# Patient Record
Sex: Female | Born: 1985 | Race: White | Hispanic: No | Marital: Married | Smoking: Never smoker
Health system: Southern US, Community
[De-identification: ages and names within clinical notes are randomized; demographics above are authoritative.]

## PROBLEM LIST (undated history)

## (undated) DIAGNOSIS — E079 Disorder of thyroid, unspecified: Secondary | ICD-10-CM

## (undated) HISTORY — PX: EYE SURGERY: SHX253

---

## 2020-12-06 ENCOUNTER — Other Ambulatory Visit: Payer: Self-pay

## 2020-12-06 ENCOUNTER — Emergency Department (HOSPITAL_COMMUNITY)
Admission: EM | Admit: 2020-12-06 | Discharge: 2020-12-06 | Disposition: A | Discharge status: Home / Self Care | Attending: Emergency Medicine | Admitting: Emergency Medicine

## 2020-12-06 ENCOUNTER — Emergency Department (HOSPITAL_COMMUNITY)

## 2020-12-06 ENCOUNTER — Encounter (HOSPITAL_COMMUNITY): Payer: Self-pay | Admitting: Emergency Medicine

## 2020-12-06 DIAGNOSIS — R0781 Pleurodynia: Secondary | ICD-10-CM | POA: Insufficient documentation

## 2020-12-06 DIAGNOSIS — R0602 Shortness of breath: Secondary | ICD-10-CM | POA: Diagnosis not present

## 2020-12-06 DIAGNOSIS — M791 Myalgia, unspecified site: Secondary | ICD-10-CM | POA: Diagnosis not present

## 2020-12-06 DIAGNOSIS — Z20822 Contact with and (suspected) exposure to covid-19: Secondary | ICD-10-CM | POA: Insufficient documentation

## 2020-12-06 DIAGNOSIS — E039 Hypothyroidism, unspecified: Secondary | ICD-10-CM | POA: Insufficient documentation

## 2020-12-06 DIAGNOSIS — R112 Nausea with vomiting, unspecified: Secondary | ICD-10-CM | POA: Insufficient documentation

## 2020-12-06 DIAGNOSIS — R197 Diarrhea, unspecified: Secondary | ICD-10-CM | POA: Diagnosis not present

## 2020-12-06 DIAGNOSIS — R000 Tachycardia, unspecified: Secondary | ICD-10-CM | POA: Diagnosis not present

## 2020-12-06 DIAGNOSIS — R1010 Upper abdominal pain, unspecified: Secondary | ICD-10-CM | POA: Insufficient documentation

## 2020-12-06 HISTORY — DX: Disorder of thyroid, unspecified: E07.9

## 2020-12-06 LAB — CBC WITH DIFFERENTIAL/PLATELET
Abs Immature Granulocytes: 0.03 10*3/uL (ref 0.00–0.07)
Basophils Absolute: 0 10*3/uL (ref 0.0–0.1)
Basophils Relative: 0 %
Eosinophils Absolute: 0 10*3/uL (ref 0.0–0.5)
Eosinophils Relative: 0 %
HCT: 32.6 % — ABNORMAL LOW (ref 36.0–46.0)
Hemoglobin: 11.3 g/dL — ABNORMAL LOW (ref 12.0–15.0)
Immature Granulocytes: 0 %
Lymphocytes Relative: 1 %
Lymphs Abs: 0.1 10*3/uL — ABNORMAL LOW (ref 0.7–4.0)
MCH: 30.2 pg (ref 26.0–34.0)
MCHC: 34.7 g/dL (ref 30.0–36.0)
MCV: 87.2 fL (ref 80.0–100.0)
Monocytes Absolute: 0.2 10*3/uL (ref 0.1–1.0)
Monocytes Relative: 2 %
Neutro Abs: 8.1 10*3/uL — ABNORMAL HIGH (ref 1.7–7.7)
Neutrophils Relative %: 97 %
Platelets: 118 10*3/uL — ABNORMAL LOW (ref 150–400)
RBC: 3.74 MIL/uL — ABNORMAL LOW (ref 3.87–5.11)
RDW: 12.2 % (ref 11.5–15.5)
WBC: 8.4 10*3/uL (ref 4.0–10.5)
nRBC: 0 % (ref 0.0–0.2)

## 2020-12-06 LAB — URINALYSIS, ROUTINE W REFLEX MICROSCOPIC
Bilirubin Urine: NEGATIVE
Glucose, UA: NEGATIVE mg/dL
Hgb urine dipstick: NEGATIVE
Ketones, ur: 20 mg/dL — AB
Nitrite: NEGATIVE
Protein, ur: NEGATIVE mg/dL
Specific Gravity, Urine: >1.046 — ABNORMAL HIGH (ref 1.005–1.030)
pH: 5 (ref 5.0–8.0)

## 2020-12-06 LAB — COMPREHENSIVE METABOLIC PANEL
ALT: 13 U/L (ref 0–44)
AST: 23 U/L (ref 15–41)
Albumin: 3.5 g/dL (ref 3.5–5.0)
Alkaline Phosphatase: 45 U/L (ref 38–126)
Anion gap: 9 (ref 5–15)
BUN: 14 mg/dL (ref 6–20)
CO2: 19 mmol/L — ABNORMAL LOW (ref 22–32)
Calcium: 8.1 mg/dL — ABNORMAL LOW (ref 8.9–10.3)
Chloride: 111 mmol/L (ref 98–111)
Creatinine, Ser: 0.84 mg/dL (ref 0.44–1.00)
GFR, Estimated: >60 mL/min (ref >60)
Glucose, Bld: 160 mg/dL — ABNORMAL HIGH (ref 70–99)
Potassium: 3.1 mmol/L — ABNORMAL LOW (ref 3.5–5.1)
Sodium: 139 mmol/L (ref 135–145)
Total Bilirubin: 2.6 mg/dL — ABNORMAL HIGH (ref 0.3–1.2)
Total Protein: 5.8 g/dL — ABNORMAL LOW (ref 6.5–8.1)

## 2020-12-06 LAB — RESP PANEL BY RT-PCR (FLU A&B, COVID) ARPGX2
Influenza A by PCR: NEGATIVE
Influenza B by PCR: NEGATIVE
SARS Coronavirus 2 by RT PCR: NEGATIVE

## 2020-12-06 LAB — TSH: TSH: 0.969 u[IU]/mL (ref 0.350–4.500)

## 2020-12-06 LAB — TROPONIN I (HIGH SENSITIVITY)
Troponin I (High Sensitivity): 4 ng/L (ref <18)
Troponin I (High Sensitivity): 3 ng/L (ref <18)

## 2020-12-06 LAB — I-STAT BETA HCG BLOOD, ED (MC, WL, AP ONLY): I-stat hCG, quantitative: <5 m[IU]/mL (ref <5)

## 2020-12-06 LAB — D-DIMER, QUANTITATIVE: D-Dimer, Quant: 3.21 ug/mL-FEU — ABNORMAL HIGH (ref 0.00–0.50)

## 2020-12-06 LAB — LIPASE, BLOOD: Lipase: 27 U/L (ref 11–51)

## 2020-12-06 MED ORDER — IOHEXOL 350 MG/ML SOLN
75.0000 mL | Freq: Once | INTRAVENOUS | Status: AC | PRN
  Administered 2020-12-06: 75 mL via INTRAVENOUS

## 2020-12-06 MED ORDER — LORAZEPAM 2 MG/ML IJ SOLN
0.5000 mg | Freq: Once | INTRAMUSCULAR | Status: DC
  Filled 2020-12-06: qty 1

## 2020-12-06 MED ORDER — PANTOPRAZOLE SODIUM 40 MG IV SOLR
40.0000 mg | Freq: Once | INTRAVENOUS | Status: AC
  Administered 2020-12-06: 40 mg via INTRAVENOUS
  Filled 2020-12-06: qty 40

## 2020-12-06 MED ORDER — SODIUM CHLORIDE 0.9 % IV BOLUS
1000.0000 mL | Freq: Once | INTRAVENOUS | Status: AC
  Administered 2020-12-06: 1000 mL via INTRAVENOUS

## 2020-12-06 MED ORDER — LORAZEPAM 2 MG/ML IJ SOLN
1.0000 mg | Freq: Once | INTRAMUSCULAR | Status: AC
  Administered 2020-12-06: 1 mg via INTRAVENOUS

## 2020-12-06 MED ORDER — ONDANSETRON 4 MG PO TBDP: 4.0000 mg | ORAL_TABLET | Freq: Three times a day (TID) | ORAL | 0 refills | Status: AC | PRN

## 2020-12-06 MED ORDER — IBUPROFEN 800 MG PO TABS
800.0000 mg | ORAL_TABLET | Freq: Once | ORAL | Status: AC
  Administered 2020-12-06: 800 mg via ORAL
  Filled 2020-12-06: qty 1

## 2020-12-06 MED ORDER — POTASSIUM CHLORIDE CRYS ER 20 MEQ PO TBCR
40.0000 meq | EXTENDED_RELEASE_TABLET | Freq: Once | ORAL | Status: AC
  Administered 2020-12-06: 40 meq via ORAL
  Filled 2020-12-06: qty 2

## 2020-12-06 MED ORDER — ALBUTEROL SULFATE HFA 108 (90 BASE) MCG/ACT IN AERS
2.0000 | INHALATION_SPRAY | Freq: Once | RESPIRATORY_TRACT | Status: AC
  Administered 2020-12-06: 2 via RESPIRATORY_TRACT
  Filled 2020-12-06: qty 6.7

## 2020-12-06 MED ORDER — ONDANSETRON HCL 4 MG/2ML IJ SOLN
4.0000 mg | Freq: Once | INTRAMUSCULAR | Status: AC
  Administered 2020-12-06: 4 mg via INTRAVENOUS
  Filled 2020-12-06: qty 2

## 2020-12-06 MED ORDER — NAPROXEN 500 MG PO TABS: 500.0000 mg | ORAL_TABLET | Freq: Two times a day (BID) | ORAL | 0 refills | Status: AC | PRN

## 2020-12-06 MED ORDER — POTASSIUM CHLORIDE CRYS ER 20 MEQ PO TBCR: 20.0000 meq | EXTENDED_RELEASE_TABLET | Freq: Every day | ORAL | 0 refills | Status: AC

## 2020-12-06 NOTE — ED Notes (Signed)
PA is at the bedside 

## 2020-12-06 NOTE — ED Notes (Signed)
Lab notified of add on TSH

## 2020-12-06 NOTE — ED Triage Notes (Signed)
Patient arrives via EMS from home- reports onset of fever, body aches with slight abd pain since last night approx 8 pm.  The patient reports being exposed to someone with the flu yesterday but was wearing face shield protection.  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

## 2020-12-06 NOTE — Discharge Instructions (Addendum)
You were seen in the ER today due to trouble breathing, vomiting, diarrhea, and chest pain.   Your blood work showed that your potassium was low- we gave you a supplement in the ER and are sending you home with a few days of this, please see attached diet guidelines.   Your labs also showed that you are hemoglobin (anemia) and your platelet counts are mildly low, your bilirubin is elevated, and you have additional findings of dehydration.   Please have your blood work rechecked by primary care within 1 week.   Your flu test and Covid test were negative.  With your D-dimer test being elevated we obtained a CT scan of your chest which did not show any blood clots or other significant abnormalities.  We are sending home with the following medicines to continue to help to treat your symptoms: -Zofran: Take every 8 hours as needed for nausea and vomiting -Naproxen: Take every 12 hours with food as needed for pain.  Take other NSAIDs with this such as Motrin, Advil, Aleve, Goody powder, Mobic, ibuprofen etc. as this is a similar medicine -Potassium chloride: Take daily for the next 5 days, this is a potassium supplement.  Please call your primary care doctor today to schedule an appointment for follow-up soon as possible.  Return to the ER for any new or worsening symptoms including but not limited to increased trouble breathing, new or worsening chest pain, passing out, inability to keep fluids down, blood in vomit or stool, new or worsening abdominal pain, abnormal bleeding, fever, or any other concerns.

## 2020-12-06 NOTE — ED Notes (Signed)
Patient to CT.

## 2020-12-06 NOTE — ED Notes (Signed)
Returns from CT- requesting medication for headache

## 2020-12-06 NOTE — ED Provider Notes (Addendum)
[facility]-EMERGENCY DEPT Provider Note   CSN: [id number] Arrival date & time: 12/06/20  [id number]    History Chief Complaint  Patient presents with  . Shortness of Breath    Beth Hensley is a 35 y.o. female with a hx of hypothyroidism who presents to the ED via EMS with complaints of dyspnea & N/V since last night.  Patient states she started to feel poorly last night with fatigue & subsequent N/V, vomiting every hour, has since started to feel short of breath with left sided pleuritic chest pain, upper abdominal pain, chills, generalized aches, and had 1 episode of diarrhea. Febrile to 101 with EMS, given tylenol PTA. No other alleviating/aggravating factors. Denies syncope, cough, hemoptysis, unilateral leg swelling, recent long travel/surgery, personal hx of VTE/cancer, or hormone use. Family hx of blood clots. Vaccinated against covid & influenza. Denies sick contacts w/ similar. Had tattoo done 12/04/20 which haw been healing as expected.   HPI     Past Medical History:  Diagnosis Date  . Thyroid disease     There are no problems to display for this patient.   Past Surgical History:  Procedure Laterality Date  . EYE SURGERY       OB History    Gravida  2   Para  2   Term      Preterm      AB      Living        SAB      IAB      Ectopic      Multiple      Live Births              History reviewed. No pertinent family history.  Social History   Tobacco Use  . Smoking status: Never Smoker  . Smokeless tobacco: Never Used  Vaping Use  . Vaping Use: Never used  Substance Use Topics  . Alcohol use: Yes    Alcohol/week: 1.0 standard drink    Types: 1 Glasses of wine per week  . Drug use: Yes    Comment: oxycodone prescribed for back pain    Home Medications Prior to Admission medications   Not on File    Allergies    Patient has no known allergies.  Review of Systems   Review of Systems  Constitutional: Positive for  chills, fatigue and fever.  HENT: Negative for congestion.   Respiratory: Positive for shortness of breath. Negative for cough.   Cardiovascular: Positive for chest pain. Negative for leg swelling.  Gastrointestinal: Positive for abdominal pain, diarrhea, nausea and vomiting. Negative for blood in stool.  Genitourinary: Negative for dysuria.  Musculoskeletal: Positive for myalgias.  Neurological: Negative for syncope.  All other systems reviewed and are negative.   Physical Exam Updated Vital Signs BP 122/73 (BP Location: Right Arm)   Pulse (!) 118   Temp 99.1 F (37.3 C) (Oral)   Resp (!) 22   Ht 5\' 3"  (1.6 m)   Wt 56.7 kg   LMP 11/22/2020 (Exact Date)   SpO2 100%   BMI 22.14 kg/m   Physical Exam Vitals and nursing note reviewed.  Constitutional:      General: She is not in acute distress.    Appearance: She is well-developed. She is not toxic-appearing.  HENT:     Head: Normocephalic and atraumatic.  Eyes:     General:        Right eye: No discharge.  Left eye: No discharge.     Conjunctiva/sclera: Conjunctivae normal.     Pupils: Pupils are equal, round, and reactive to light.  Cardiovascular:     Rate and Rhythm: Regular rhythm. Tachycardia present.     Comments: 2+ symmetric DP pulses.  Pulmonary:     Effort: No respiratory distress.     Breath sounds: Normal breath sounds. No wheezing, rhonchi or rales.  Chest:     Chest wall: No deformity, tenderness or crepitus.  Abdominal:     General: There is no distension.     Palpations: Abdomen is soft.     Tenderness: There is abdominal tenderness (diffuse upper). There is no guarding or rebound.  Musculoskeletal:     Cervical back: Neck supple.     Right lower leg: No edema.     Left lower leg: No edema.     Comments: Moving all extremities.   Skin:    General: Skin is warm and dry.     Findings: No rash.     Comments: Multiple tattoos including to lower back, no erythema/warmth/drainage noted.    Neurological:     Mental Status: She is alert.     Comments: Clear speech.   Psychiatric:        Behavior: Behavior normal.    ED Results / Procedures / Treatments   Labs (all labs ordered are listed, but only abnormal results are displayed) Labs Reviewed  COMPREHENSIVE METABOLIC PANEL - Abnormal; Notable for the following components:      Result Value   Potassium 3.1 (*)    CO2 19 (*)    Glucose, Bld 160 (*)    Calcium 8.1 (*)    Total Protein 5.8 (*)    Total Bilirubin 2.6 (*)    All other components within normal limits  CBC WITH DIFFERENTIAL/PLATELET - Abnormal; Notable for the following components:   RBC 3.74 (*)    Hemoglobin 11.3 (*)    HCT 32.6 (*)    Platelets 118 (*)    Neutro Abs 8.1 (*)    Lymphs Abs 0.1 (*)    All other components within normal limits  D-DIMER, QUANTITATIVE - Abnormal; Notable for the following components:   D-Dimer, Quant 3.21 (*)    All other components within normal limits  RESP PANEL BY RT-PCR (FLU A&B, COVID) ARPGX2  LIPASE, BLOOD  I-STAT BETA HCG BLOOD, ED ([facility], [facility], [facility] ONLY)  TROPONIN I (HIGH SENSITIVITY)  TROPONIN I (HIGH SENSITIVITY)    EKG EKG Interpretation  Date/Time:  Wednesday December 06 2020 09:15:08 EDT Ventricular Rate:  122 PR Interval:  137 QRS Duration: 88 QT Interval:  335 QTC Calculation: 478 R Axis:   82 Text Interpretation: Sinus tachycardia Borderline repolarization abnormality Sinus tachycardia, normal intervals, no previous to compare to Confirmed by Coralee Pesa [phone]) on 12/06/2020 9:46:04 AM   Radiology CT Angio Chest PE W/Cm &/Or Wo Cm  Result Date: 12/06/2020 CLINICAL DATA:  Positive D-dimer. EXAM: CT ANGIOGRAPHY CHEST WITH CONTRAST TECHNIQUE: Multidetector CT imaging of the chest was performed using the standard protocol during bolus administration of intravenous contrast. Multiplanar CT image reconstructions and MIPs were obtained to evaluate the vascular anatomy. CONTRAST:  12mL OMNIPAQUE IOHEXOL  350 MG/ML SOLN COMPARISON:  None. FINDINGS: Cardiovascular: No filling defects in the pulmonary arteries to suggest pulmonary emboli. Heart is normal size. Aorta is normal caliber. Mediastinum/Nodes: No mediastinal, hilar, or axillary adenopathy. Trachea and esophagus are unremarkable. Thyroid unremarkable. Lungs/Pleura: Lungs are clear. No focal airspace  opacities or suspicious nodules. No effusions. Upper Abdomen: Imaging into the upper abdomen demonstrates no acute findings. Musculoskeletal: Chest wall soft tissues are unremarkable. No acute bony abnormality. Review of the MIP images confirms the above findings. IMPRESSION: No acute cardiopulmonary disease. Electronically Signed   By: Charlett Nose M.D.   On: 12/06/2020 11:52   DG Chest Portable 1 View  Result Date: 12/06/2020 CLINICAL DATA:  Chest pain and dyspnea EXAM: PORTABLE CHEST 1 VIEW COMPARISON:  None. FINDINGS: The heart size and mediastinal contours are within normal limits. Both lungs are clear. The visualized skeletal structures are unremarkable. IMPRESSION: No active disease. Electronically Signed   By: Alcide Clever M.D.   On: 12/06/2020 10:00    Procedures Procedures   Medications Ordered in ED Medications  sodium chloride 0.9 % bolus 1,000 mL (0 mLs Intravenous Stopped 12/06/20 1218)  pantoprazole (PROTONIX) injection 40 mg (40 mg Intravenous Given 12/06/20 1116)  ondansetron (ZOFRAN) injection 4 mg (4 mg Intravenous Given 12/06/20 1116)  sodium chloride 0.9 % bolus 1,000 mL (0 mLs Intravenous Stopped 12/06/20 1316)  potassium chloride SA (KLOR-CON) CR tablet 40 mEq (40 mEq Oral Given 12/06/20 1116)  iohexol (OMNIPAQUE) 350 MG/ML injection 75 mL (75 mLs Intravenous Contrast Given 12/06/20 1132)  ibuprofen (ADVIL) tablet 800 mg (800 mg Oral Given 12/06/20 1218)  albuterol (VENTOLIN HFA) 108 (90 Base) MCG/ACT inhaler 2 puff (2 puffs Inhalation Given 12/06/20 1219)  sodium chloride 0.9 % bolus 1,000 mL (1,000 mLs Intravenous New Bag/Given  12/06/20 1317)  LORazepam (ATIVAN) injection 1 mg (1 mg Intravenous Given 12/06/20 1400)    ED Course  I have reviewed the triage vital signs and the nursing notes.  Pertinent labs & imaging results that were available during my care of the patient were reviewed by me and considered in my medical decision making (see chart for details).  Demecia Northway was evaluated in Emergency Department on 12/06/2020 for the symptoms described in the history of present illness. He/she was evaluated in the context of the global COVID-19 pandemic, which necessitated consideration that the patient might be at risk for infection with the SARS-CoV-2 virus that causes COVID-19. Institutional protocols and algorithms that pertain to the evaluation of patients at risk for COVID-19 are in a state of rapid change based on information released by regulatory bodies including the CDC and federal and state organizations. These policies and algorithms were followed during the patient's care in the ED.    MDM Rules/Calculators/A&P                          Patient presents to the ED with complaints of dyspnea, N/V/D, & constitutional symptoms. On arrival patient is tachycardic, vitals otherwise reassuring. Temp reported to be 101 prior to arrival, received tylenol en route.   Additional history obtained:  Additional history obtained from chart review & nursing note review.   EKG: Sinus tachycardia Borderline repolarization abnormality Sinus tachycardia, normal intervals, no previous to compare to   Lab Tests:  I Ordered, reviewed, and interpreted labs, which included:  Covid/influenza: Negative CBC: Mild anemia.  CMP: Elevated t bili @ 2.6, mild hypokalemia & hypocalcemia. Bicarb 19, anion gap WNL  Lipase: WNL Preg test :Negative D-dimer: elevated- CTA ordered.   Imaging Studies ordered:  I ordered imaging studies which included CXR & subsequent CTA, I independently reviewed, formal radiology impression shows:  CXR: No  active disease CTA: No acute cardiopulmonary disease.  ED Course:  12:05: RE-EVAL:  Patient overall feeling improved, GI sxs resolved, currently has mild headache as well as continued dyspnea. No nuchal rigidity, no neuro deficits. Will give ibuprofen & trial albuterol inhaler.   Continued tachycardia, additional 1L fliuds ordered, add on TSH.   14:00: Per RN patient feeling nauseous- ativan ordered.   TSH: WNL UA: No urinary sxs to raise concern for UTI, small leuks nitrite negative, ketonuria & elevated specific gravity consistent w/ suspected dehydration.   Patient without ischemic EKG changes, delta troponin flat, doubt ACS.  Low risk wells, d-dimer positive--> CTA negative for PE. CTA is also negative for pneumonia, pneumothorax, or findings to suggest CHF. Labs with mild electrolyte abnormalities- receiving NS in the ED, oral potassium. Mildly elevated T bili, LFTs WNL, repeat abdominal exam nontender, negative murphys, PCP recheck, do not suspect acute surgical abdomen with reassuring serial exams and tolerating PO. Her SpO2 is 05-100% on RA @ rest, she is not in respiratory distress, able to ambulate without significant desaturation, remains in upper 90s per discussion with nursing. No meningismus or neuro deficits. HR on reassessment 101 bpm.   She is feeling somewhat improved, tolerating PO, overall unclear definitive etiology, reassuring ED work-up, suspect this could be viral. Plan for supportive tx & PCP follow up. I discussed results, treatment plan, need for follow-up, and return precautions with the patient & her husband @ bedside. Provided opportunity for questions, patient & her husband confirmed understanding and are in agreement with plan.   Portions of this note were generated with Scientist, clinical (histocompatibility and immunogenetics). Dictation errors may occur despite best attempts at proofreading.  Findings and plan of care discussed with supervising physician Dr. Wilkie Aye.   Final Clinical  Impression(s) / ED Diagnoses Final diagnoses:  Shortness of breath  Nausea vomiting and diarrhea    Rx / DC Orders ED Discharge Orders         Ordered    ondansetron (ZOFRAN ODT) 4 MG disintegrating tablet  Every 8 hours PRN        12/06/20 1502    potassium chloride SA (KLOR-CON) 20 MEQ tablet  Daily        12/06/20 1502    naproxen (NAPROSYN) 500 MG tablet  2 times daily PRN        12/06/20 1502           Kamauri Denardo, [place] R, PA-C 12/06/20 1526    Sincerity Cedar, [place] R, PA-C 12/06/20 1527    Rozelle Logan, DO 12/07/20 1953

## 2020-12-06 NOTE — ED Notes (Signed)
Patient ambulated in the hallway with staff and her husband- portable 02 saturation monitor with oxygen reading of 97-98%  Patient appears very drowsy from earlier medication but answers all questions appropriately

## 2020-12-06 NOTE — ED Notes (Signed)
Patient's husband was given written and verbal instructions regarding discharge.  He verbalizes understanding those instructions

## 2020-12-13 ENCOUNTER — Other Ambulatory Visit: Payer: Self-pay

## 2020-12-13 ENCOUNTER — Emergency Department (HOSPITAL_COMMUNITY)

## 2020-12-13 ENCOUNTER — Emergency Department (HOSPITAL_COMMUNITY)
Admission: EM | Admit: 2020-12-13 | Discharge: 2020-12-13 | Disposition: A | Discharge status: Home / Self Care | Attending: Emergency Medicine | Admitting: Emergency Medicine

## 2020-12-13 ENCOUNTER — Encounter (HOSPITAL_COMMUNITY): Payer: Self-pay

## 2020-12-13 ENCOUNTER — Emergency Department (HOSPITAL_BASED_OUTPATIENT_CLINIC_OR_DEPARTMENT_OTHER)

## 2020-12-13 DIAGNOSIS — R0602 Shortness of breath: Secondary | ICD-10-CM | POA: Diagnosis not present

## 2020-12-13 DIAGNOSIS — L739 Follicular disorder, unspecified: Secondary | ICD-10-CM | POA: Diagnosis not present

## 2020-12-13 DIAGNOSIS — R7989 Other specified abnormal findings of blood chemistry: Secondary | ICD-10-CM

## 2020-12-13 DIAGNOSIS — M7989 Other specified soft tissue disorders: Secondary | ICD-10-CM | POA: Diagnosis not present

## 2020-12-13 DIAGNOSIS — R2243 Localized swelling, mass and lump, lower limb, bilateral: Secondary | ICD-10-CM | POA: Diagnosis present

## 2020-12-13 DIAGNOSIS — R609 Edema, unspecified: Secondary | ICD-10-CM

## 2020-12-13 LAB — CBC WITH DIFFERENTIAL/PLATELET
Abs Immature Granulocytes: 1.3 10*3/uL — ABNORMAL HIGH (ref 0.00–0.07)
Basophils Absolute: 0.1 10*3/uL (ref 0.0–0.1)
Basophils Relative: 1 %
Eosinophils Absolute: 0 10*3/uL (ref 0.0–0.5)
Eosinophils Relative: 0 %
HCT: 36.7 % (ref 36.0–46.0)
Hemoglobin: 12.3 g/dL (ref 12.0–15.0)
Immature Granulocytes: 9 %
Lymphocytes Relative: 27 %
Lymphs Abs: 3.9 10*3/uL (ref 0.7–4.0)
MCH: 29.9 pg (ref 26.0–34.0)
MCHC: 33.5 g/dL (ref 30.0–36.0)
MCV: 89.3 fL (ref 80.0–100.0)
Monocytes Absolute: 1.5 10*3/uL — ABNORMAL HIGH (ref 0.1–1.0)
Monocytes Relative: 10 %
Neutro Abs: 7.8 10*3/uL — ABNORMAL HIGH (ref 1.7–7.7)
Neutrophils Relative %: 53 %
Platelets: 203 10*3/uL (ref 150–400)
RBC: 4.11 MIL/uL (ref 3.87–5.11)
RDW: 13.1 % (ref 11.5–15.5)
WBC: 14.6 10*3/uL — ABNORMAL HIGH (ref 4.0–10.5)
nRBC: 0 % (ref 0.0–0.2)

## 2020-12-13 LAB — COMPREHENSIVE METABOLIC PANEL
ALT: 37 U/L (ref 0–44)
AST: 20 U/L (ref 15–41)
Albumin: 3.6 g/dL (ref 3.5–5.0)
Alkaline Phosphatase: 70 U/L (ref 38–126)
Anion gap: 8 (ref 5–15)
BUN: 16 mg/dL (ref 6–20)
CO2: 31 mmol/L (ref 22–32)
Calcium: 9.1 mg/dL (ref 8.9–10.3)
Chloride: 104 mmol/L (ref 98–111)
Creatinine, Ser: 0.69 mg/dL (ref 0.44–1.00)
GFR, Estimated: >60 mL/min (ref >60)
Glucose, Bld: 80 mg/dL (ref 70–99)
Potassium: 3.3 mmol/L — ABNORMAL LOW (ref 3.5–5.1)
Sodium: 143 mmol/L (ref 135–145)
Total Bilirubin: 0.5 mg/dL (ref 0.3–1.2)
Total Protein: 6.2 g/dL — ABNORMAL LOW (ref 6.5–8.1)

## 2020-12-13 LAB — I-STAT BETA HCG BLOOD, ED (MC, WL, AP ONLY): I-stat hCG, quantitative: <5 m[IU]/mL (ref <5)

## 2020-12-13 LAB — TSH: TSH: 17.207 u[IU]/mL — ABNORMAL HIGH (ref 0.350–4.500)

## 2020-12-13 LAB — BRAIN NATRIURETIC PEPTIDE: B Natriuretic Peptide: 113.7 pg/mL — ABNORMAL HIGH (ref 0.0–100.0)

## 2020-12-13 LAB — TROPONIN I (HIGH SENSITIVITY): Troponin I (High Sensitivity): <2 ng/L (ref <18)

## 2020-12-13 MED ORDER — DOXYCYCLINE HYCLATE 100 MG PO CAPS: 100.0000 mg | ORAL_CAPSULE | Freq: Two times a day (BID) | ORAL | 0 refills | Status: AC

## 2020-12-13 NOTE — ED Triage Notes (Signed)
Emergency Medicine Provider Triage Evaluation Note  Beth Hensley , a 35 y.o. female  was evaluated in triage.  Pt complains of ble edema, sob, chest pain starting a few days ago. States she had petechiae a few days ago that is improving fam hx vte. See on 4/13 and had neg cta chest.  Review of Systems  Positive: ble edema, chest pain, sob Negative: fever  Physical Exam  BP 124/90   Pulse 76   Temp 98.1 F (36.7 C)   Resp 18   LMP 11/22/2020 (Exact Date)   SpO2 99%  Gen:   Awake, no distress   HEENT:  Atraumatic  Resp:  Normal effort  Cardiac:  Normal rate  Abd:   Nondistended, mild epigastric ttp MSK:   Moves extremities without difficulty  Neuro:  Speech clear   Medical Decision Making  Medically screening exam initiated at 4:30 PM.  Appropriate orders placed.  Beth Hensley was informed that the remainder of the evaluation will be completed by another provider, this initial triage assessment does not replace that evaluation, and the importance of remaining in the ED until their evaluation is complete.  Clinical Impression     MSE was initiated and I personally evaluated the patient and placed orders (if any) at  4:33 PM on December 13, 2020.  The patient appears stable so that the remainder of the MSE may be completed by another provider.    Beth Hensley, [place] 12/13/20 1633

## 2020-12-13 NOTE — Progress Notes (Signed)
Lower extremity venous bilateral study completed.  Preliminary results relayed to [place], PA.  See CV Proc for preliminary results report.   Jillyan Plitt, RDMS, RVT  

## 2020-12-13 NOTE — ED Notes (Signed)
US at bedside

## 2020-12-13 NOTE — Discharge Instructions (Addendum)
It was a pleasure taking care of you here in the emergency department.  As discussed in the event your chest x-ray did not show any evidence of heart failure, fluid on your lungs, pneumonia.  Your thyroid level was elevated.  You will need to follow-up with your primary care provider for this  Make sure to elevate your legs over the next few days.   Your ultrasound did not show any evidence of blood clot or infectious process in your legs.  You do have a rash on your back which is consistent with folliculitis.  I have written you a prescription for doxycycline.  Please take with food.  Follow-up with your primary care provider within the next week

## 2020-12-13 NOTE — ED Triage Notes (Signed)
Pt reports SHOB and bilateral leg swelling since 4/13. Pt reports following up with PCP today and was sent here to rule out DVT. Pt also endorses numbness to right upper leg.

## 2020-12-13 NOTE — ED Provider Notes (Signed)
[facility]-EMERGENCY DEPT Provider Note   CSN: [id number] Arrival date & time: 12/13/20  1612     History Chief Complaint  Patient presents with  . Shortness of Breath  . Leg Swelling    Beth Hensley is a 35 y.o. female with past medical history significant for thyroid disease who presents for evaluation of bilateral lower extremity edema.  Patient states seen here in the emergency department approximately 1 week ago due to shortness of breath.  Had unremarkable work-up.  CTA of her chest was negative for any PE, pneumonia.  Patient states her symptoms have generally improved.  She does have occasional shortness of breath.  However not worsening.  Does state that she was seen by her PCP today.  She had a long road trip last week where she drove to [place].  She felt like she had bilateral lower extremity edema since then.  She has never had an ultrasound of this.  She denies any personal history of blood clots or does have a family history of blood clots.  No current chest pain.  She questions whether anxiety is causing her shortness of breath.  No recent fever, chills, nausea, vomiting, hemoptysis, dysuria, hematuria, paresthesias or weakness.  Does states she had a tattoo approximately 1 week ago to her lower back.  She has noted a rash to this area since.  Denies additional aggravating or alleviating factors.  No history of heart failure.  History obtained from patient and past medical records.  No interpreter used  HPI     Past Medical History:  Diagnosis Date  . Thyroid disease     There are no problems to display for this patient.   Past Surgical History:  Procedure Laterality Date  . EYE SURGERY       OB History    Gravida  2   Para  2   Term      Preterm      AB      Living        SAB      IAB      Ectopic      Multiple      Live Births              History reviewed. No pertinent family history.  Social History   Tobacco  Use  . Smoking status: Never Smoker  . Smokeless tobacco: Never Used  Vaping Use  . Vaping Use: Never used  Substance Use Topics  . Alcohol use: Yes    Alcohol/week: 1.0 standard drink    Types: 1 Glasses of wine per week  . Drug use: Yes    Comment: oxycodone prescribed for back pain    Home Medications Prior to Admission medications   Medication Sig Start Date End Date Taking? Authorizing Provider  doxycycline (VIBRAMYCIN) 100 MG capsule Take 1 capsule (100 mg total) by mouth 2 (two) times daily for 7 days. 12/13/20 12/20/20 Yes Debe Anfinson A, PA-C  levothyroxine (SYNTHROID) 100 MCG tablet Take 100 mcg by mouth daily. 08/24/19   [provider]  naproxen (NAPROSYN) 500 MG tablet Take 1 tablet (500 mg total) by mouth 2 (two) times daily as needed for moderate pain. 12/06/20   Petrucelli, Samantha R, PA-C  ondansetron (ZOFRAN ODT) 4 MG disintegrating tablet Take 1 tablet (4 mg total) by mouth every 8 (eight) hours as needed for nausea or vomiting. 12/06/20   Petrucelli, Samantha R, PA-C  potassium chloride SA (KLOR-CON) 20  MEQ tablet Take 1 tablet (20 mEq total) by mouth daily. 12/06/20   Petrucelli, Pleas Koch, PA-C    Allergies    Patient has no known allergies.  Review of Systems   Review of Systems  Constitutional: Negative.   HENT: Negative.   Respiratory: Positive for shortness of breath.   Cardiovascular: Positive for leg swelling (BL). Negative for palpitations.  Gastrointestinal: Negative.   Genitourinary: Negative.   Musculoskeletal: Negative.   Skin: Positive for rash. Negative for pallor and wound.  Neurological: Negative.   All other systems reviewed and are negative.  Physical Exam Updated Vital Signs BP 117/83 (BP Location: Left Arm)   Pulse 65   Temp 98.1 F (36.7 C)   Resp 16   LMP 11/22/2020 (Exact Date)   SpO2 98%   Physical Exam Vitals and nursing note reviewed.  Constitutional:      General: She is not in acute distress.     Appearance: She is well-developed. She is not ill-appearing, toxic-appearing or diaphoretic.     Comments: tearful  HENT:     Head: Normocephalic and atraumatic.  Eyes:     Pupils: Pupils are equal, round, and reactive to light.  Cardiovascular:     Rate and Rhythm: Normal rate.     Pulses: Normal pulses.     Heart sounds: Normal heart sounds.  Pulmonary:     Effort: Pulmonary effort is normal. No respiratory distress.     Breath sounds: Normal breath sounds. No decreased breath sounds, wheezing, rhonchi or rales.     Comments: Speaks in full sentences without difficulty.  Lungs clear to auscultation bilaterally. Chest:     Comments: Equal rise and fall to chest wall.  No step-off, crepitus Abdominal:     General: Bowel sounds are normal. There is no distension.     Palpations: Abdomen is soft. There is no mass.     Tenderness: There is no abdominal tenderness. There is no guarding or rebound.     Comments: Soft, nontender without rebound or guarding  Musculoskeletal:        General: Normal range of motion.     Cervical back: Normal range of motion.     Right lower leg: No tenderness. No edema.     Left lower leg: No tenderness. No edema.     Comments: No bony tenderness.  Moves all 4 extremity without difficulty.  No appreciable lower extremity edema.  Skin:    General: Skin is warm and dry.     Capillary Refill: Capillary refill takes less than 2 seconds.     Comments: Rash diffusely located around lumbar area with multiple, small pustules.  Nondermatomal pattern.  Crosses the midline.  No vesicles, bullae, desquamated skin.   Neurological:     General: No focal deficit present.     Mental Status: She is alert and oriented to person, place, and time.    ED Results / Procedures / Treatments   Labs (all labs ordered are listed, but only abnormal results are displayed) Labs Reviewed  COMPREHENSIVE METABOLIC PANEL - Abnormal; Notable for the following components:      Result  Value   Potassium 3.3 (*)    Total Protein 6.2 (*)    All other components within normal limits  CBC WITH DIFFERENTIAL/PLATELET - Abnormal; Notable for the following components:   WBC 14.6 (*)    Neutro Abs 7.8 (*)    Monocytes Absolute 1.5 (*)    Abs Immature Granulocytes 1.30 (*)  All other components within normal limits  BRAIN NATRIURETIC PEPTIDE - Abnormal; Notable for the following components:   B Natriuretic Peptide 113.7 (*)    All other components within normal limits  TSH - Abnormal; Notable for the following components:   TSH 17.207 (*)    All other components within normal limits  I-STAT BETA HCG BLOOD, ED ([facility], [facility], [facility] ONLY)  TROPONIN I (HIGH SENSITIVITY)    EKG None  Radiology DG Chest 2 View  Result Date: 12/13/2020 CLINICAL DATA:  Shortness of breath EXAM: CHEST - 2 VIEW COMPARISON:  12/06/2020 FINDINGS: The heart size and mediastinal contours are within normal limits. Both lungs are clear. The visualized skeletal structures are unremarkable. IMPRESSION: No active cardiopulmonary disease. Electronically Signed   By: Jasmine Pang M.D.   On: 12/13/2020 17:17   VAS [place] LOWER EXTREMITY VENOUS (DVT) (ONLY [facility] & [facility] 7a-7p)  Result Date: 12/13/2020  Lower Venous DVT Study Indications: Swelling, and SOB.  Comparison Study: No prior studies. Performing Technologist: Jean Rosenthal RDMS,RVT  Examination Guidelines: A complete evaluation includes B-mode imaging, spectral Doppler, color Doppler, and power Doppler as needed of all accessible portions of each vessel. Bilateral testing is considered an integral part of a complete examination. Limited examinations for reoccurring indications may be performed as noted. The reflux portion of the exam is performed with the patient in reverse Trendelenburg.  +---------+---------------+---------+-----------+----------+--------------+ RIGHT    CompressibilityPhasicitySpontaneityPropertiesThrombus Aging  +---------+---------------+---------+-----------+----------+--------------+ CFV      Full           Yes      Yes                                 +---------+---------------+---------+-----------+----------+--------------+ SFJ      Full                                                        +---------+---------------+---------+-----------+----------+--------------+ FV Prox  Full                                                        +---------+---------------+---------+-----------+----------+--------------+ FV Mid   Full                                                        +---------+---------------+---------+-----------+----------+--------------+ FV DistalFull                                                        +---------+---------------+---------+-----------+----------+--------------+ PFV      Full                                                        +---------+---------------+---------+-----------+----------+--------------+  POP      Full           Yes      Yes                                 +---------+---------------+---------+-----------+----------+--------------+ PTV      Full                                                        +---------+---------------+---------+-----------+----------+--------------+ PERO     Full                                                        +---------+---------------+---------+-----------+----------+--------------+   +---------+---------------+---------+-----------+----------+--------------+ LEFT     CompressibilityPhasicitySpontaneityPropertiesThrombus Aging +---------+---------------+---------+-----------+----------+--------------+ CFV      Full           Yes      Yes                                 +---------+---------------+---------+-----------+----------+--------------+ SFJ      Full                                                         +---------+---------------+---------+-----------+----------+--------------+ FV Prox  Full                                                        +---------+---------------+---------+-----------+----------+--------------+ FV Mid   Full                                                        +---------+---------------+---------+-----------+----------+--------------+ FV DistalFull                                                        +---------+---------------+---------+-----------+----------+--------------+ PFV      Full                                                        +---------+---------------+---------+-----------+----------+--------------+ POP      Full           Yes      Yes                                 +---------+---------------+---------+-----------+----------+--------------+  PTV      Full                                                        +---------+---------------+---------+-----------+----------+--------------+ PERO     Full                                                        +---------+---------------+---------+-----------+----------+--------------+     Summary: RIGHT: - There is no evidence of deep vein thrombosis in the lower extremity.  - No cystic structure found in the popliteal fossa.  LEFT: - There is no evidence of deep vein thrombosis in the lower extremity.  - No cystic structure found in the popliteal fossa.  *See table(s) above for measurements and observations.    Preliminary     Procedures Procedures   Medications Ordered in ED Medications - No data to display  ED Course  I have reviewed the triage vital signs and the nursing notes.  Pertinent labs & imaging results that were available during my care of the patient were reviewed by me and considered in my medical decision making (see chart for details).  35 year old here for evaluation of multiple complaints.  She is afebrile, nonseptic, not ill-appearing.  Was seen  here for last week for shortness of breath.  Labs and imaging reviewed.  Had CTA which is negative for PE, infectious process.  Was febrile at that time.  States she has noted lower extremity edema.  Her compartments are soft.  She has no bony tenderness.  I do not appreciate any lower extremity edema on exam.  States her shortness of breath that she was here for last week has improved.  Still has some intermittent shortness of breath. No hemoptysis.  Patient questions whether anxiety causes her to have shortness of breath.  She does appear anxious and tearful in room.  She has rash located to her lower back with small pustules surround her in her tattoo which she got approximately 1 week ago.  Rash consistent with folliculitis.  Low suspicion for cellulitis, abscess.  We will plan on labs, imaging and reassess  Labs and imaging personally reviewed and interpreted:  DG chest without any evidence of cardiomegaly, pulmonary edema, pneumothorax, infiltrates Ultrasound negative for DVT however does have enlarged inguinal lymph node per [place] tech Pregnancy test negative CBC with leukocytosis at 14.6 TSH 17.2 BNP 113.7 Trop <2 CMP Potassium 3.3 EKG without ischemic changes  Patient reassessed.  Discussed labs and imaging.  Unclear etiology of patient's feeling of sensation of swelling in her legs.  She has no evidence of pretibial myxedema, pitting edema or any infectious process on exam. No amlodipine or other meds to cause edema. [place] reassuring without any evidence of DVT.  She is Wells criteria low risk, she is without tachycardia, tachypnea or hypoxia.  Had CTA of her chest last week with similar complaints.  I have low suspicion for acute PE.  Her abdominal exam is benign without evidence of acute surgical abdomen. No evidence of anasarca on exam. On repeat exam patient does not have a surgical abdomin and there are no peritoneal signs.  No indication of  appendicitis, bowel obstruction, bowel  perforation, cholecystitis, diverticulitis, PID or ectopic pregnancy.  She did request that we get a "immunology panel."  I am not too sure what she is asking for it.  She says she spoke to her PCP and they were requesting this.  I reviewed her chart in epic, do not see any notes from PCP. She has no joint inflammation on exam.  No excessive diarrhea to suggest IBD.  We will have her follow with PCP for this as I do not feel this is emergently needed at this time.  With regards to her thyroid level.  Elevated here.  She is currently on Synthroid.  [facility]  Discussed close follow-up.  She has no evidence of acute hyper or hypothyroid emergency requiring admission to hospital at this time.  The patient has been appropriately medically screened and/or stabilized in the ED. I have low suspicion for any other emergent medical condition which would require further screening, evaluation or treatment in the ED or require inpatient management.  Patient is hemodynamically stable and in no acute distress.  Patient able to ambulate in department prior to ED.  Evaluation does not show acute pathology that would require ongoing or additional emergent interventions while in the emergency department or further inpatient treatment.  I have discussed the diagnosis with the patient and answered all questions.  Pain is been managed while in the emergency department and patient has no further complaints prior to discharge.  Patient is comfortable with plan discussed in room and is stable for discharge at this time.  I have discussed strict return precautions for returning to the emergency department.  Patient was encouraged to follow-up with PCP/specialist refer to at discharge.    MDM Rules/Calculators/A&P                          Charlynn CourtDarcy Skerritt was evaluated in Emergency Department on 12/13/2020 for the symptoms described in the history of present illness. She was evaluated in the context of the  global COVID-19 pandemic, which necessitated consideration that the patient might be at risk for infection with the SARS-CoV-2 virus that causes COVID-19. Institutional protocols and algorithms that pertain to the evaluation of patients at risk for COVID-19 are in a state of rapid change based on information released by regulatory bodies including the CDC and federal and state organizations. These policies and algorithms were followed during the patient's care in the ED. Final Clinical Impression(s) / ED Diagnoses Final diagnoses:  Peripheral edema  Elevated TSH  Folliculitis    Rx / DC Orders ED Discharge Orders         Ordered    doxycycline (VIBRAMYCIN) 100 MG capsule  2 times daily        12/13/20 1948           Jericho Alcorn A, PA-C 12/13/20 [id number]    Pfeiffer, Marcy, MD 12/26/20 1524

## 2020-12-13 NOTE — ED Notes (Signed)
PA-C at bedside 

## 2022-02-26 IMAGING — DX DG CHEST 1V PORT
1 series · 1 of 1 positions shown · non-contrast
Comparison: None.

CLINICAL DATA: Chest pain and dyspnea

EXAM:
PORTABLE CHEST 1 VIEW

[chest ap]
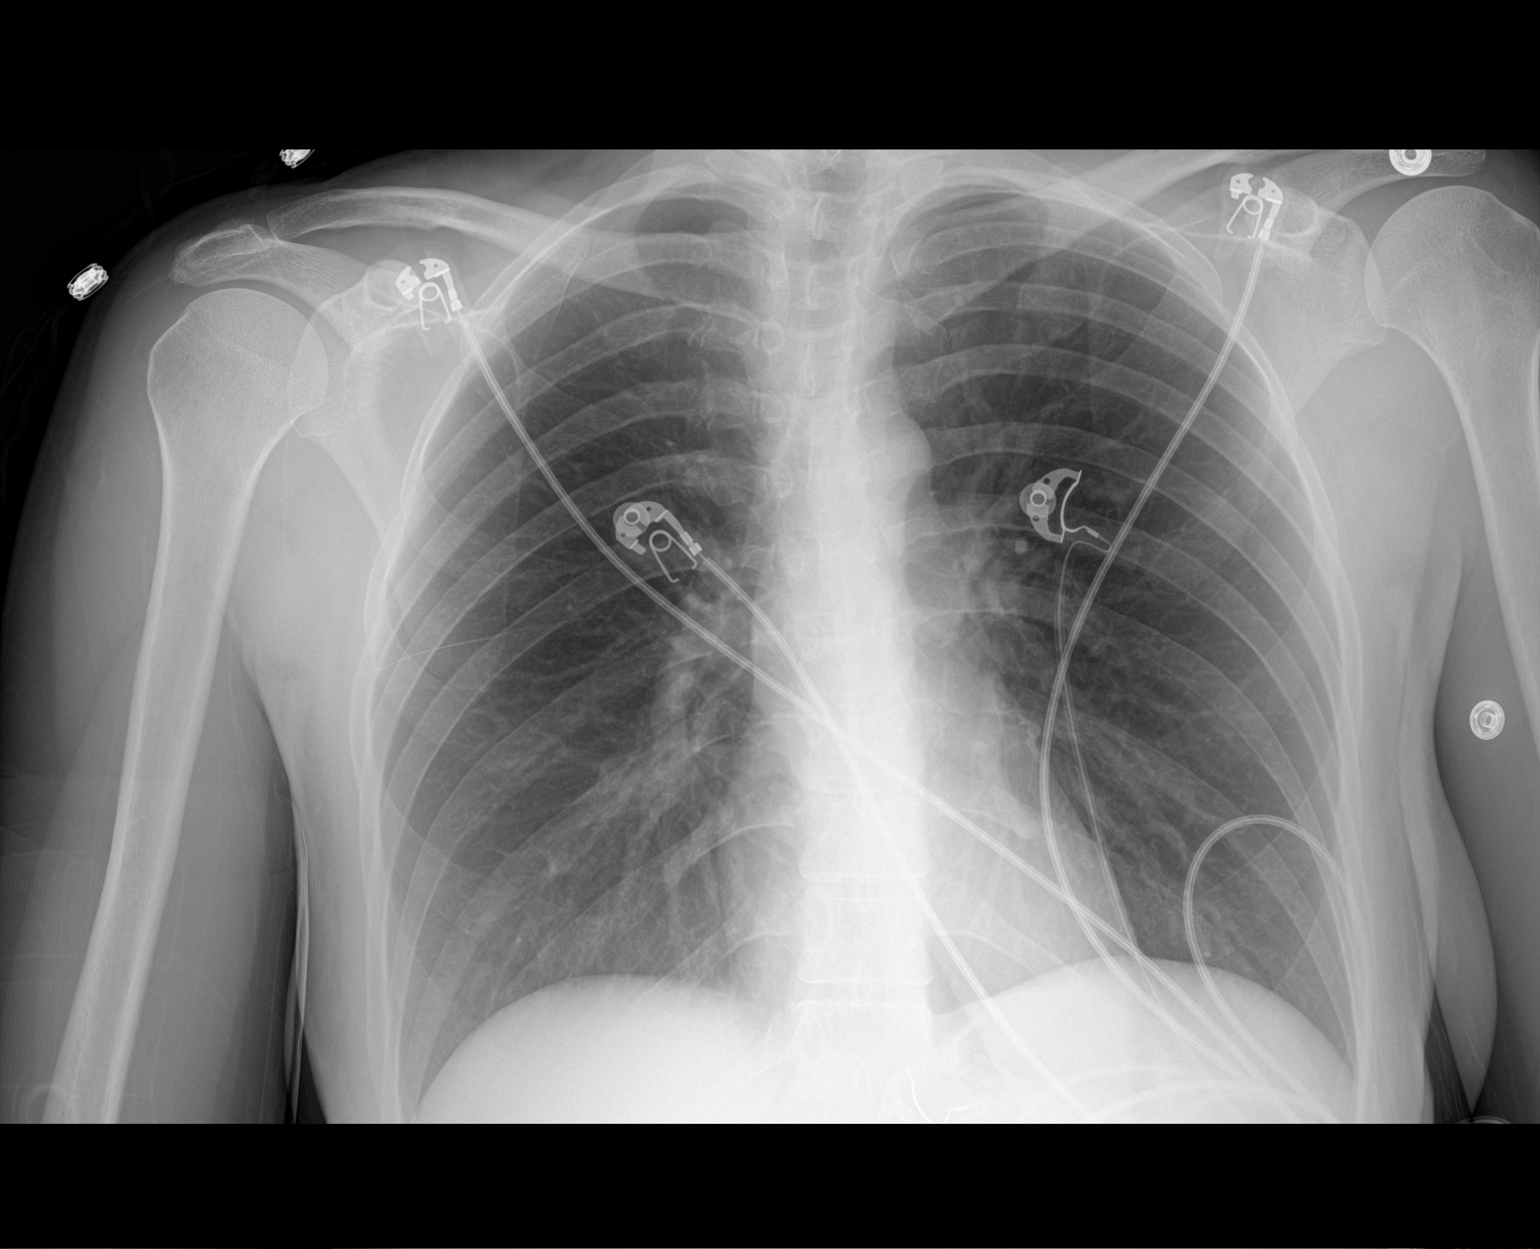

[1 of 1 positions shown; findings below may reference images not displayed]

FINDINGS: The heart size and mediastinal contours are within normal limits.
Both lungs are clear. The visualized skeletal structures are
unremarkable.
IMPRESSION: No active disease.

## 2022-02-26 IMAGING — CT CT ANGIO CHEST
2 of 6 series · 18 of 36 positions shown · IV contrast (omnipaque)
Comparison: None.

CLINICAL DATA: Positive D-dimer.

EXAM:
CT ANGIOGRAPHY CHEST WITH CONTRAST
TECHNIQUE: Multidetector CT imaging of the chest was performed using the
standard protocol during bolus administration of intravenous
contrast. Multiplanar CT image reconstructions and MIPs were
obtained to evaluate the vascular anatomy.
CONTRAST:  75mL OMNIPAQUE IOHEXOL 350 MG/ML SOLN

[Series 5: thins · axial · 0.63mm/px · z∈[-188,+56]mm · 17 of 276 slices shown]
[im 16/276  lung]
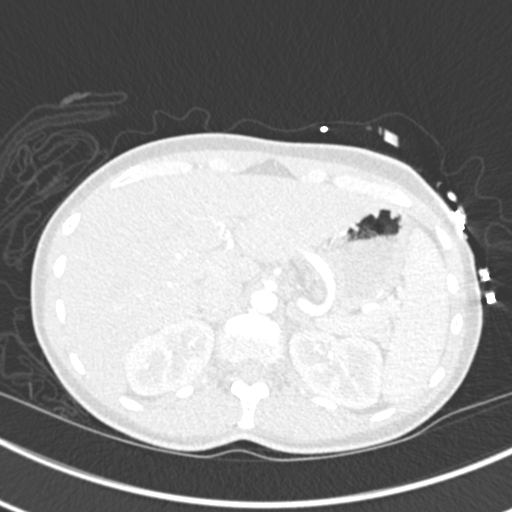
[im 31/276  mediastinal]
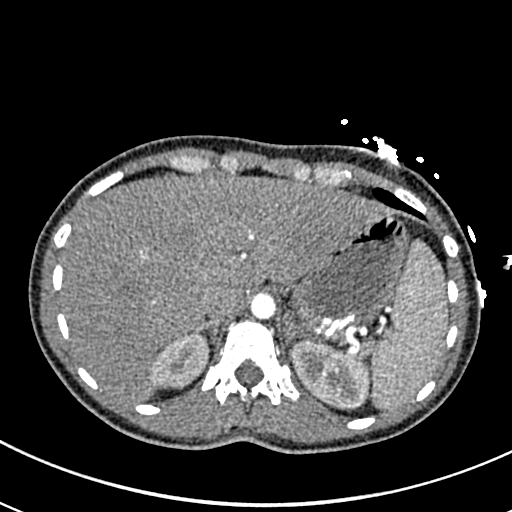
[im 46/276  lung]
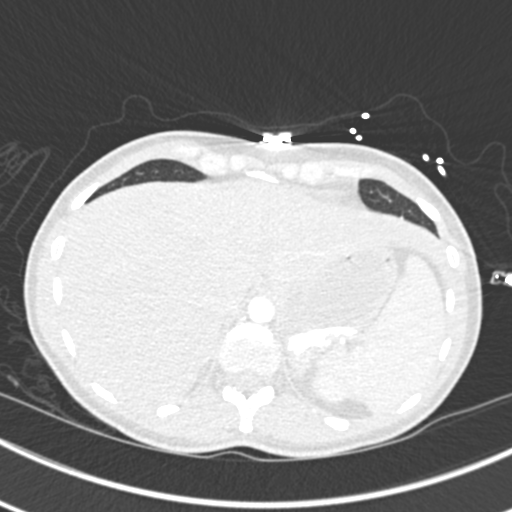
[im 62/276  mediastinal]
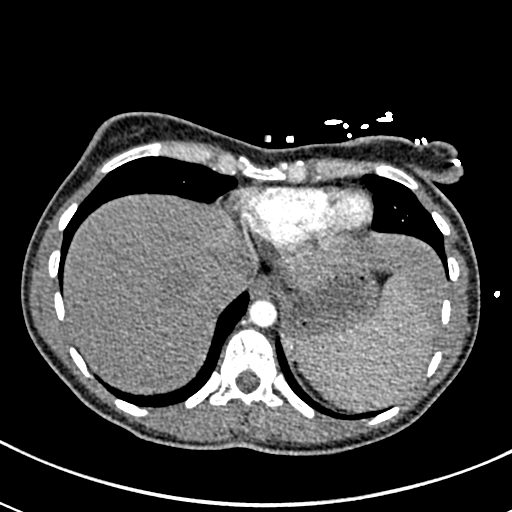
[im 77/276  lung]
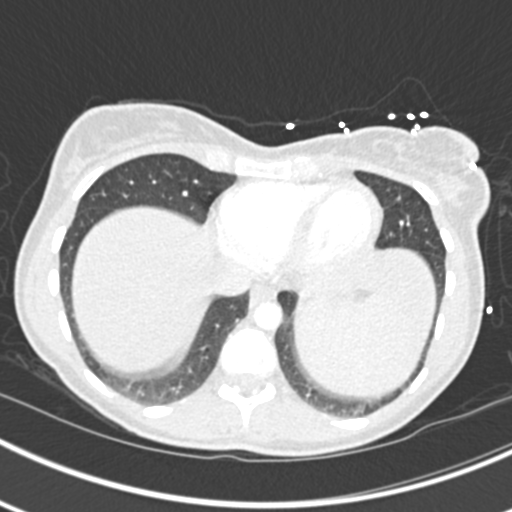
[im 92/276  mediastinal]
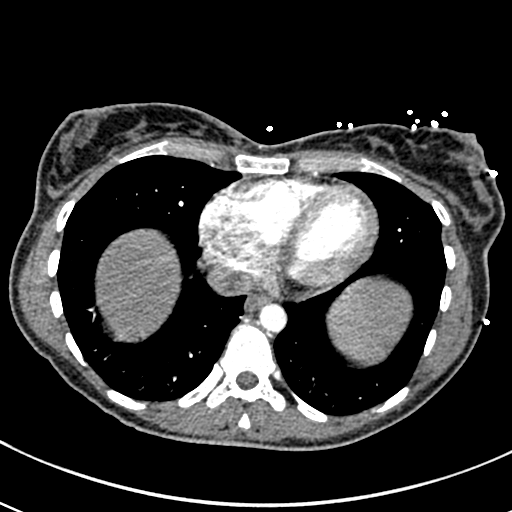
[im 107/276  lung]
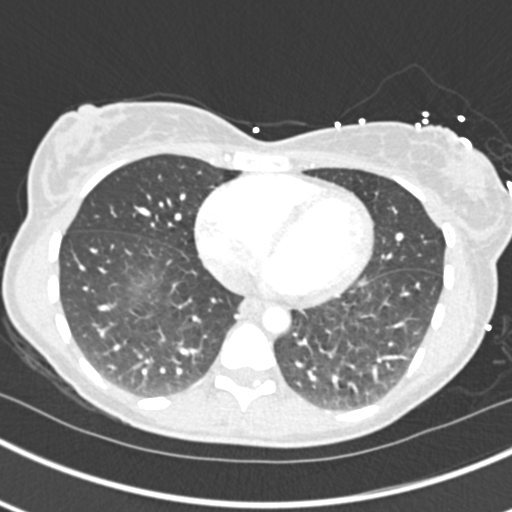
[im 123/276  mediastinal]
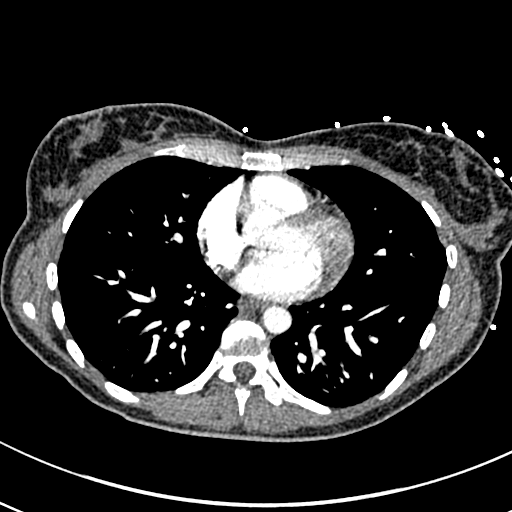
[im 138/276  lung]
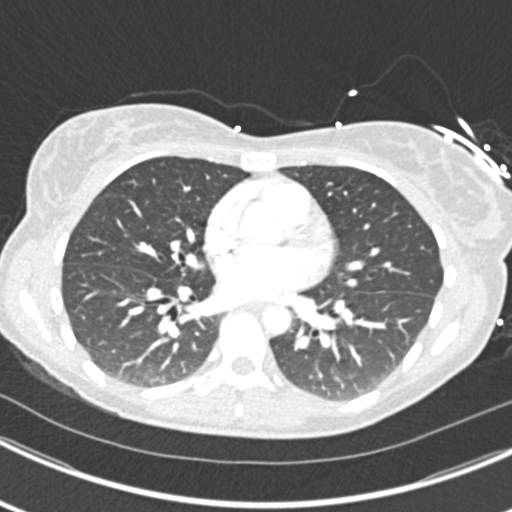
[im 153/276  mediastinal]
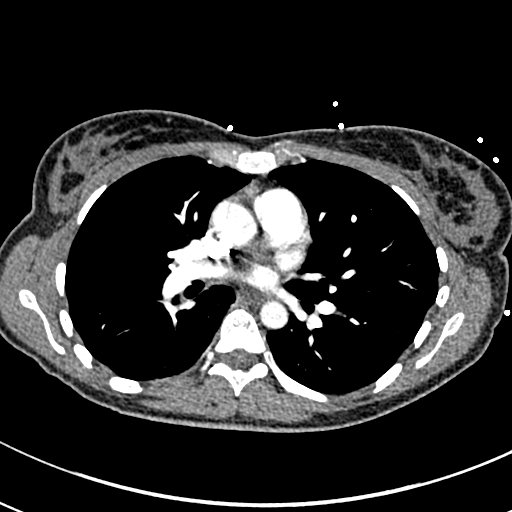
[im 169/276  lung]
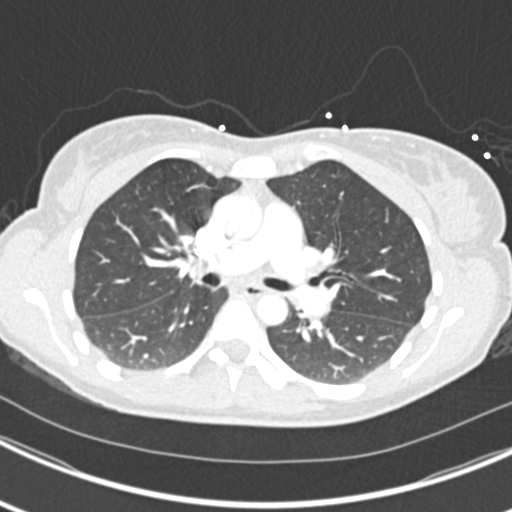
[im 184/276  mediastinal]
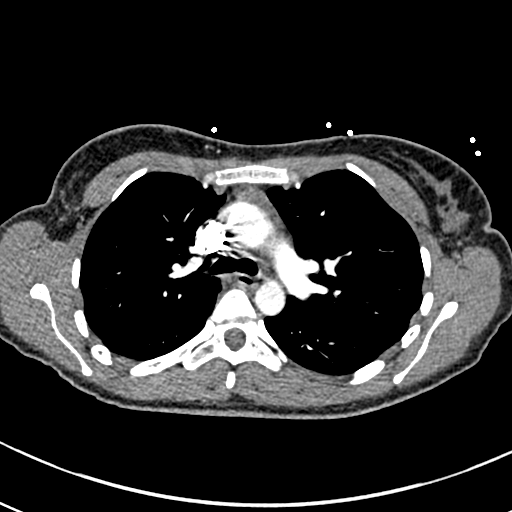
[im 199/276  lung]
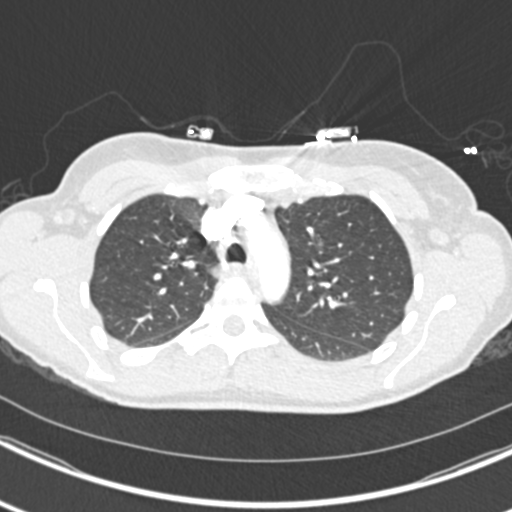
[im 214/276  mediastinal]
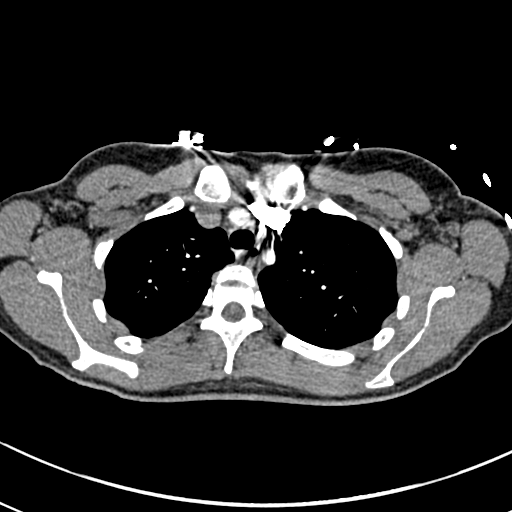
[im 230/276  lung]
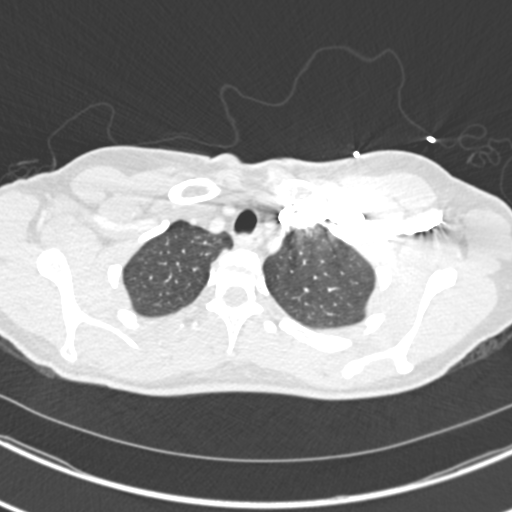
[im 245/276  mediastinal]
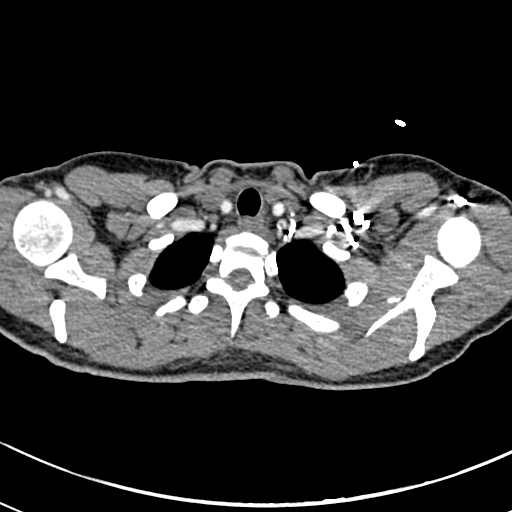
[im 260/276  lung]
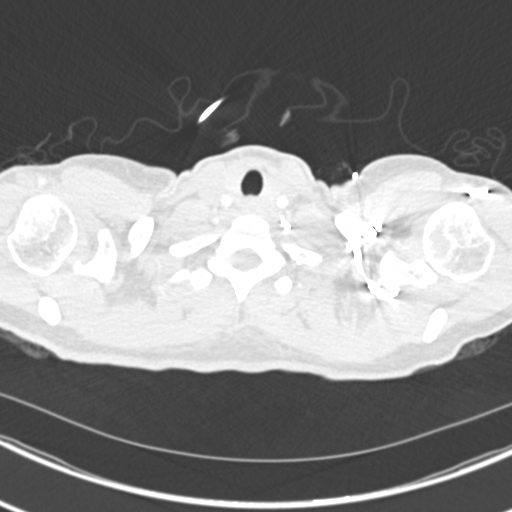

[Series 7: coronal mpr · coronal · 0.56mm/px · 1 of 102 slices shown]
[im 51/102  mediastinal]
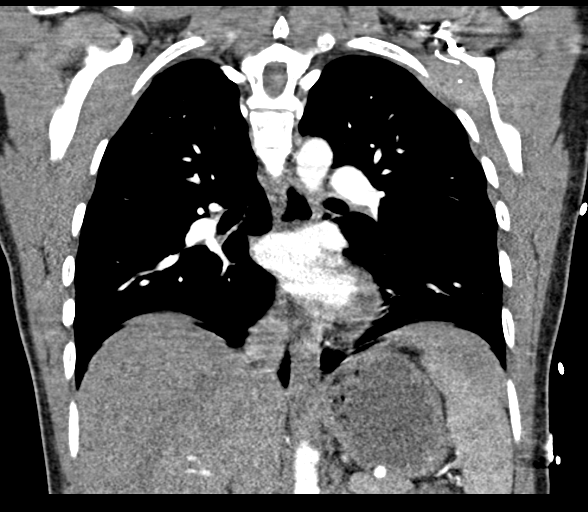

[18 of 36 positions shown; findings below may reference images not displayed]

FINDINGS: Cardiovascular: No filling defects in the pulmonary arteries to
suggest pulmonary emboli. Heart is normal size. Aorta is normal
caliber.

Mediastinum/Nodes: No mediastinal, hilar, or axillary adenopathy.
Trachea and esophagus are unremarkable. Thyroid unremarkable.

Lungs/Pleura: Lungs are clear. No focal airspace opacities or
suspicious nodules. No effusions.

Upper Abdomen: Imaging into the upper abdomen demonstrates no acute
findings.

Musculoskeletal: Chest wall soft tissues are unremarkable. No acute
bony abnormality.

Review of the MIP images confirms the above findings.
IMPRESSION: No acute cardiopulmonary disease.

## 2024-10-25 ENCOUNTER — Ambulatory Visit
